# Patient Record
Sex: Male | Born: 1960 | Race: White | Hispanic: No | Marital: Married | Smoking: Former smoker
Health system: Southern US, Community
[De-identification: ages and names within clinical notes are randomized; demographics above are authoritative.]

## PROBLEM LIST (undated history)

## (undated) DIAGNOSIS — E7800 Pure hypercholesterolemia, unspecified: Secondary | ICD-10-CM

## (undated) DIAGNOSIS — Z992 Dependence on renal dialysis: Secondary | ICD-10-CM

## (undated) HISTORY — PX: CATARACT EXTRACTION W/ INTRAOCULAR LENS IMPLANT: SHX1309

## (undated) HISTORY — PX: KIDNEY TRANSPLANT: SHX239

---

## 2015-03-02 ENCOUNTER — Telehealth: Payer: Self-pay

## 2015-03-02 NOTE — Telephone Encounter (Signed)
Gastroenterology Pre-Procedure Review  Request Date: 02/25/2015 Requesting Physician: Dr. Windy Kalata ( Nephrologist)  PATIENT REVIEW QUESTIONS: The patient responded to the following health history questions as indicated:    Pt had kidney transplant in 1983/ please advise if pt needs ov first  1. Diabetes Melitis: no 2. Joint replacements in the past 12 months: no 3. Major health problems in the past 3 months: no 4. Has an artificial valve or MVP: no 5. Has a defibrillator: no 6. Has been advised in past to take antibiotics in advance of a procedure like teeth cleaning: no    MEDICATIONS & ALLERGIES:    Patient reports the following regarding taking any blood thinners:   Plavix? no Aspirin? no Coumadin? no  Patient confirms/reports the following medications:  Current Outpatient Prescriptions  Medication Sig Dispense Refill  . NON FORMULARY Taking three 15 mg tablets daily per spouse    . predniSONE (DELTASONE) 10 MG tablet Take 10 mg by mouth daily with breakfast.    . simvastatin (ZOCOR) 10 MG tablet Take 10 mg by mouth daily.     No current facility-administered medications for this visit.    Patient confirms/reports the following allergies:  No Known Allergies  No orders of the defined types were placed in this encounter.    AUTHORIZATION INFORMATION Primary Insurance:  ID #:   Group #:  Pre-Cert / Auth required:  Pre-Cert / Auth #:   Secondary Insurance:   ID #:   Group #:  Pre-Cert / Auth required:  Pre-Cert / Auth #:   SCHEDULE INFORMATION: Procedure has been scheduled as follows:  Date:                    Time:   Location:   This Gastroenterology Pre-Precedure Review Form is being routed to the following provider(s): Barney Drain, MD

## 2015-03-03 NOTE — Telephone Encounter (Signed)
SUPREP SPLIT DOSING- FULL LIQUIDS WITH BREAKFAST.   Full Liquid Diet A high-calorie, high-protein supplement should be used to meet your nutritional requirements when the full liquid diet is continued for more than 2 or 3 days. If this diet is to be used for an extended period of time (more than 7 days), a multivitamin should be considered.  Breads and Starches  Allowed: None are allowed except crackers WHOLE OR pureed (made into a thick, smooth soup) in soup.   Avoid: Any others.    Potatoes/Pasta/Rice  Allowed: ANY ITEM AS A SOUP OR SMALL PLATE OF MASHED POTATOES.       Vegetables  Allowed: Strained tomato or vegetable juice. Vegetables pureed in soup.   Avoid: Any others.    Fruit  Allowed: Any strained fruit juices and fruit drinks. Include 1 serving of citrus or vitamin C-enriched fruit juice daily.   Avoid: Any others.  Meat and Meat Substitutes  Allowed: Egg  Avoid: Any meat, fish, or fowl. All cheese.  Milk  Allowed: Milk beverages, including milk shakes and instant breakfast mixes. Smooth yogurt.   Avoid: Any others. Avoid dairy products if not tolerated.    Soups and Combination Foods  Allowed: Broth, strained cream soups. Strained, broth-based soups.   Avoid: Any others.    Desserts and Sweets  Allowed: flavored gelatin,plain ice cream, sherbet, smooth pudding, junket, fruit ices, frozen ice pops, pudding pops,, frozen fudge pops, chocolate syrup. Sugar, honey, jelly, syrup.   Avoid: Any others.  Fats and Oils  Allowed: Margarine, butter, cream, sour cream, oils.   Avoid: Any others.  Beverages  Allowed: All.   Avoid: None.  Condiments  Allowed: Iodized salt, pepper, spices, flavorings. Cocoa powder.   Avoid: Any others.    SAMPLE MEAL PLAN Breakfast   cup orange juice.   1 OR 2 EGGS   1 cup  milk.   1 cup beverage (coffee or tea).   Cream or sugar, if desired.    Midmorning Snack  2 SCRAMBLED OR HARD BOILED  EGG   Lunch  1 cup cream soup.    cup fruit juice.   1 cup milk.    cup custard.   1 cup beverage (coffee or tea).   Cream or sugar, if desired.    Midafternoon Snack  1 cup milk shake.  Dinner  1 cup cream soup.    cup fruit juice.   1 cup milk.    cup pudding.   1 cup beverage (coffee or tea).   Cream or sugar, if desired.  Evening Snack  1 cup supplement.  To increase calories, add sugar, cream, butter, or margarine if possible. Nutritional supplements will also increase the total calories.

## 2015-03-04 ENCOUNTER — Other Ambulatory Visit: Payer: Self-pay

## 2015-03-04 DIAGNOSIS — Z1211 Encounter for screening for malignant neoplasm of colon: Secondary | ICD-10-CM

## 2015-03-04 MED ORDER — NA SULFATE-K SULFATE-MG SULF 17.5-3.13-1.6 GM/177ML PO SOLN: 1.0000 | ORAL | Status: AC

## 2015-03-04 NOTE — Telephone Encounter (Signed)
Rx sent to the pharmacy and instructions mailed to pt.  

## 2015-03-16 ENCOUNTER — Telehealth: Payer: Self-pay

## 2015-03-16 NOTE — Telephone Encounter (Signed)
Ginger, please follow up on this since I will be on vacation the remainder of the week. Thanks!

## 2015-03-16 NOTE — Telephone Encounter (Signed)
I called [employer] @ [phone] and spoke to Ellie Lunch who said a PA is required for screening colonoscopy. She has started the process and the reference # is [id number].

## 2015-03-17 NOTE — Telephone Encounter (Signed)
The case was approved. I will have the paper work scaned

## 2015-03-22 ENCOUNTER — Other Ambulatory Visit: Payer: Self-pay

## 2015-03-22 ENCOUNTER — Telehealth: Payer: Self-pay | Admitting: Gastroenterology

## 2015-03-22 ENCOUNTER — Encounter (HOSPITAL_COMMUNITY)
Admission: RE | Disposition: A | Discharge status: Home / Self Care | Payer: Self-pay | Source: Ambulatory Visit | Attending: Gastroenterology

## 2015-03-22 ENCOUNTER — Ambulatory Visit (HOSPITAL_COMMUNITY)
Admission: RE | Admit: 2015-03-22 | Discharge: 2015-03-22 | Disposition: A | Discharge status: Home / Self Care | Payer: 59 | Source: Ambulatory Visit | Attending: Gastroenterology | Admitting: Gastroenterology

## 2015-03-22 ENCOUNTER — Encounter (HOSPITAL_COMMUNITY): Payer: Self-pay | Admitting: *Deleted

## 2015-03-22 DIAGNOSIS — Z1211 Encounter for screening for malignant neoplasm of colon: Secondary | ICD-10-CM | POA: Diagnosis not present

## 2015-03-22 DIAGNOSIS — Z79899 Other long term (current) drug therapy: Secondary | ICD-10-CM | POA: Diagnosis not present

## 2015-03-22 DIAGNOSIS — K5730 Diverticulosis of large intestine without perforation or abscess without bleeding: Secondary | ICD-10-CM | POA: Diagnosis not present

## 2015-03-22 DIAGNOSIS — D122 Benign neoplasm of ascending colon: Secondary | ICD-10-CM

## 2015-03-22 DIAGNOSIS — Z7952 Long term (current) use of systemic steroids: Secondary | ICD-10-CM | POA: Diagnosis not present

## 2015-03-22 DIAGNOSIS — K648 Other hemorrhoids: Secondary | ICD-10-CM | POA: Insufficient documentation

## 2015-03-22 DIAGNOSIS — M6289 Other specified disorders of muscle: Secondary | ICD-10-CM | POA: Insufficient documentation

## 2015-03-22 DIAGNOSIS — D125 Benign neoplasm of sigmoid colon: Secondary | ICD-10-CM | POA: Diagnosis not present

## 2015-03-22 DIAGNOSIS — E780 Pure hypercholesterolemia: Secondary | ICD-10-CM | POA: Diagnosis not present

## 2015-03-22 DIAGNOSIS — Z87891 Personal history of nicotine dependence: Secondary | ICD-10-CM | POA: Insufficient documentation

## 2015-03-22 DIAGNOSIS — D123 Benign neoplasm of transverse colon: Secondary | ICD-10-CM | POA: Insufficient documentation

## 2015-03-22 DIAGNOSIS — K644 Residual hemorrhoidal skin tags: Secondary | ICD-10-CM | POA: Diagnosis not present

## 2015-03-22 DIAGNOSIS — K6389 Other specified diseases of intestine: Secondary | ICD-10-CM | POA: Insufficient documentation

## 2015-03-22 DIAGNOSIS — D120 Benign neoplasm of cecum: Secondary | ICD-10-CM | POA: Insufficient documentation

## 2015-03-22 DIAGNOSIS — Z940 Kidney transplant status: Secondary | ICD-10-CM | POA: Diagnosis not present

## 2015-03-22 DIAGNOSIS — Z86010 Personal history of colonic polyps: Secondary | ICD-10-CM

## 2015-03-22 HISTORY — PX: COLONOSCOPY: SHX5424

## 2015-03-22 HISTORY — DX: Dependence on renal dialysis: Z99.2

## 2015-03-22 HISTORY — DX: Pure hypercholesterolemia, unspecified: E78.00

## 2015-03-22 SURGERY — COLONOSCOPY
Anesthesia: Moderate Sedation

## 2015-03-22 MED ORDER — STERILE WATER FOR IRRIGATION IR SOLN
Status: DC | PRN
  Administered 2015-03-22: 11:00:00

## 2015-03-22 MED ORDER — MEPERIDINE HCL 100 MG/ML IJ SOLN
INTRAMUSCULAR | Status: AC
  Filled 2015-03-22: qty 2

## 2015-03-22 MED ORDER — MIDAZOLAM HCL 5 MG/5ML IJ SOLN
INTRAMUSCULAR | Status: DC | PRN
  Administered 2015-03-22: 2 mg via INTRAVENOUS
  Administered 2015-03-22 (×2): 1 mg via INTRAVENOUS
  Administered 2015-03-22: 2 mg via INTRAVENOUS

## 2015-03-22 MED ORDER — SPOT INK MARKER SYRINGE KIT
PACK | SUBMUCOSAL | Status: DC | PRN
  Administered 2015-03-22: 3 mL via SUBMUCOSAL

## 2015-03-22 MED ORDER — SODIUM CHLORIDE 0.9 % IV SOLN
INTRAVENOUS | Status: DC
  Administered 2015-03-22: 10:00:00 via INTRAVENOUS

## 2015-03-22 MED ORDER — MEPERIDINE HCL 100 MG/ML IJ SOLN
INTRAMUSCULAR | Status: DC | PRN
  Administered 2015-03-22 (×3): 25 mg via INTRAVENOUS

## 2015-03-22 MED ORDER — MIDAZOLAM HCL 5 MG/5ML IJ SOLN
INTRAMUSCULAR | Status: AC
  Filled 2015-03-22: qty 10

## 2015-03-22 NOTE — Telephone Encounter (Signed)
Next colonoscopy in 1-2 MOS AT [place] 2 FLAT COLON POLYPS(CECUM/ASCENDING COLON). PT HAS IMAGES ON DISC. SEND IMAGES BY COURIER TO [facility] AS WELL.

## 2015-03-22 NOTE — Telephone Encounter (Signed)
Referral noted. Will contact Melanie from Endo to send images to [facility].

## 2015-03-22 NOTE — Discharge Instructions (Signed)
You had 6 polyps removed. YOU STILL HAVE 2 FLAT POLYPS IN YOUR RIGHT COLON THAT NEED TO BE REMOVED VIA ENDOSCOPIC MUCOSAL RESECTION. I TATTOOED THE BASE OF EACH ONE. You have MODERATE internal hemorrhoids and diverticulosis IN YOUR LEFT COLON.   FOLLOW A HIGH FIBER DIET. AVOID ITEMS THAT CAUSE BLOATING. SEE INFO BELOW.  YOUR BIOPSY RESULTS WILL BE AVAILABLE IN MY CHART AFTER JUN 23 AND MY OFFICE WILL CONTACT YOU IN 10-14 DAYS WITH YOUR RESULTS.   Next colonoscopy in 1-2 MOS AT [place] IN 1-3 YEARS AFTER THAT. TAKE YOUR CD WITH YOU TO YOUR VISIT.  YOUR SISTERS, BROTHERS, CHILDREN, AND PARENTS NEED TO HAVE A COLONOSCOPY STARTING AT THE AGE OF 40.    Colonoscopy Care After Read the instructions outlined below and refer to this sheet in the next week. These discharge instructions provide you with general information on caring for yourself after you leave the hospital. While your treatment has been planned according to the most current medical practices available, unavoidable complications occasionally occur. If you have any problems or questions after discharge, call DR. Takoda Janowiak, [phone].  ACTIVITY  You may resume your regular activity, but move at a slower pace for the next 24 hours.   Take frequent rest periods for the next 24 hours.   Walking will help get rid of the air and reduce the bloated feeling in your belly (abdomen).   No driving for 24 hours (because of the medicine (anesthesia) used during the test).   You may shower.   Do not sign any important legal documents or operate any machinery for 24 hours (because of the anesthesia used during the test).    NUTRITION  Drink plenty of fluids.   You may resume your normal diet as instructed by your doctor.   Begin with a light meal and progress to your normal diet. Heavy or fried foods are harder to digest and may make you feel sick to your stomach (nauseated).   Avoid alcoholic beverages for  24 hours or as instructed.    MEDICATIONS  You may resume your normal medications.   WHAT YOU CAN EXPECT TODAY  Some feelings of bloating in the abdomen.   Passage of more gas than usual.   Spotting of blood in your stool or on the toilet paper  .  IF YOU HAD POLYPS REMOVED DURING THE COLONOSCOPY:  Eat a soft diet IF YOU HAVE NAUSEA, BLOATING, ABDOMINAL PAIN, OR VOMITING.    FINDING OUT THE RESULTS OF YOUR TEST Not all test results are available during your visit. DR. Oneida Alar WILL CALL YOU WITHIN 14 DAYS OF YOUR PROCEDUE WITH YOUR RESULTS. Do not assume everything is normal if you have not heard from DR. Laconda Basich, CALL HER OFFICE AT [phone].  SEEK IMMEDIATE MEDICAL ATTENTION AND CALL THE OFFICE: [phone] IF:  You have more than a spotting of blood in your stool.   Your belly is swollen (abdominal distention).   You are nauseated or vomiting.   You have a temperature over 101F.   You have abdominal pain or discomfort that is severe or gets worse throughout the day.  Polyps, Colon  A polyp is extra tissue that grows inside your body. Colon polyps grow in the large intestine. The large intestine, also called the colon, is part of your digestive system. It is a long, hollow tube at the end of your digestive tract where your body makes and stores stool. Most polyps are not  dangerous. They are benign. This means they are not cancerous. But over time, some types of polyps can turn into cancer. Polyps that are smaller than a pea are usually not harmful. But larger polyps could someday become or may already be cancerous. To be safe, doctors remove all polyps and test them.   PREVENTION There is not one sure way to prevent polyps. You might be able to lower your risk of getting them if you:  Eat more fruits and vegetables and less fatty food.   Do not smoke.   Avoid alcohol.   Exercise every day.   Lose weight if you are overweight.   Eating more calcium and folate  can also lower your risk of getting polyps. Some foods that are rich in calcium are milk, cheese, and broccoli. Some foods that are rich in folate are chickpeas, kidney beans, and spinach.   High-Fiber Diet A high-fiber diet changes your normal diet to include more whole grains, legumes, fruits, and vegetables. Changes in the diet involve replacing refined carbohydrates with unrefined foods. The calorie level of the diet is essentially unchanged. The Dietary Reference Intake (recommended amount) for adult males is 38 grams per day. For adult females, it is 25 grams per day. Pregnant and lactating women should consume 28 grams of fiber per day. Fiber is the intact part of a plant that is not broken down during digestion. Functional fiber is fiber that has been isolated from the plant to provide a beneficial effect in the body. PURPOSE  Increase stool bulk.   Ease and regulate bowel movements.   Lower cholesterol.  INDICATIONS THAT YOU NEED MORE FIBER  Constipation and hemorrhoids.   Uncomplicated diverticulosis (intestine condition) and irritable bowel syndrome.   Weight management.   As a protective measure against hardening of the arteries (atherosclerosis), diabetes, and cancer.   GUIDELINES FOR INCREASING FIBER IN THE DIET  Start adding fiber to the diet slowly. A gradual increase of about 5 more grams (2 slices of whole-wheat bread, 2 servings of most fruits or vegetables, or 1 bowl of high-fiber cereal) per day is best. Too rapid an increase in fiber may result in constipation, flatulence, and bloating.   Drink enough water and fluids to keep your urine clear or pale yellow. Water, juice, or caffeine-free drinks are recommended. Not drinking enough fluid may cause constipation.   Eat a variety of high-fiber foods rather than one type of fiber.   Try to increase your intake of fiber through using high-fiber foods rather than fiber pills or supplements that contain small amounts of  fiber.   The goal is to change the types of food eaten. Do not supplement your present diet with high-fiber foods, but replace foods in your present diet.   INCLUDE A VARIETY OF FIBER SOURCES  Replace refined and processed grains with whole grains, canned fruits with fresh fruits, and incorporate other fiber sources. White rice, white breads, and most bakery goods contain little or no fiber.   Brown whole-grain rice, buckwheat oats, and many fruits and vegetables are all good sources of fiber. These include: broccoli, Brussels sprouts, cabbage, cauliflower, beets, sweet potatoes, white potatoes (skin on), carrots, tomatoes, eggplant, squash, berries, fresh fruits, and dried fruits.   Cereals appear to be the richest source of fiber. Cereal fiber is found in whole grains and bran. Bran is the fiber-rich outer coat of cereal grain, which is largely removed in refining. In whole-grain cereals, the bran remains. In breakfast cereals, the  largest amount of fiber is found in those with "bran" in their names. The fiber content is sometimes indicated on the label.   You may need to include additional fruits and vegetables each day.   In baking, for 1 cup white flour, you may use the following substitutions:   1 cup whole-wheat flour minus 2 tablespoons.   1/2 cup white flour plus 1/2 cup whole-wheat flour.   Diverticulosis Diverticulosis is a common condition that develops when small pouches (diverticula) form in the wall of the colon. The risk of diverticulosis increases with age. It happens more often in people who eat a low-fiber diet. Most individuals with diverticulosis have no symptoms. Those individuals with symptoms usually experience belly (abdominal) pain, constipation, or loose stools (diarrhea).  HOME CARE INSTRUCTIONS  Increase the amount of fiber in your diet as directed by your caregiver or dietician. This may reduce symptoms of diverticulosis.   Drink at least 6 to 8 glasses of  water each day to prevent constipation.   Try not to strain when you have a bowel movement.   Avoiding nuts and seeds to prevent complications is NOT NECESSARY.     FOODS HAVING HIGH FIBER CONTENT INCLUDE:  Fruits. Apple, peach, pear, tangerine, raisins, prunes.   Vegetables. Brussels sprouts, asparagus, broccoli, cabbage, carrot, cauliflower, romaine lettuce, spinach, summer squash, tomato, winter squash, zucchini.   Starchy Vegetables. Baked beans, kidney beans, lima beans, split peas, lentils, potatoes (with skin).   Grains. Whole wheat bread, brown rice, bran flake cereal, plain oatmeal, white rice, shredded wheat, bran muffins.    SEEK IMMEDIATE MEDICAL CARE IF:  You develop increasing pain or severe bloating.   You have an oral temperature above 101F.   You develop vomiting or bowel movements that are bloody or black.   Hemorrhoids Hemorrhoids are dilated (enlarged) veins around the rectum. Sometimes clots will form in the veins. This makes them swollen and painful. These are called thrombosed hemorrhoids. Causes of hemorrhoids include:  Constipation.   Straining to have a bowel movement.   HEAVY LIFTING  HOME CARE INSTRUCTIONS  Eat a well balanced diet and drink 6 to 8 glasses of water every day to avoid constipation. You may also use a bulk laxative.   Avoid straining to have bowel movements.   Keep anal area dry and clean.   Do not use a donut shaped pillow or sit on the toilet for long periods. This increases blood pooling and pain.   Move your bowels when your body has the urge; this will require less straining and will decrease pain and pressure.

## 2015-03-22 NOTE — H&P (Signed)
  Primary Care Physician:  Windy Kalata, MD Primary Gastroenterologist:  Dr. Oneida Alar  Pre-Procedure History & Physical: HPI:  Todd Bass is a 54 y.o. male here for COLON CANCER SCREENING.   Past Medical History  Diagnosis Date  . High cholesterol   . History of arteriovenostomy for renal dialysis     Past Surgical History  Procedure Laterality Date  . Kidney transplant Right   . Cataract extraction w/ intraocular lens implant Left     Prior to Admission medications   Medication Sig Start Date End Date Taking? Authorizing Provider  azaTHIOprine (IMURAN) 50 MG tablet Take 50 mg by mouth daily. Taking 3 tablets once daily.   Yes Historical Provider, MD  Na Sulfate-K Sulfate-Mg Sulf (SUPREP BOWEL PREP) SOLN Take 1 kit by mouth as directed. 03/04/15  Yes Danie Binder, MD  predniSONE (DELTASONE) 10 MG tablet Take 10 mg by mouth daily with breakfast.   Yes Historical Provider, MD  simvastatin (ZOCOR) 10 MG tablet Take 10 mg by mouth daily.   Yes Historical Provider, MD  NON FORMULARY Taking three 15 mg tablets daily per spouse    Historical Provider, MD    Allergies as of 03/04/2015  . (No Known Allergies)    Family History  Problem Relation Age of Onset  . Colon cancer Neg Hx     History   Social History  . Marital Status: Married    Spouse Name: N/A  . Number of Children: N/A  . Years of Education: N/A   Occupational History  . Not on file.   Social History Main Topics  . Smoking status: Former Smoker -- 2.00 packs/day for 20 years    Types: Cigarettes  . Smokeless tobacco: Not on file  . Alcohol Use: Not on file     Comment: 6 pack beer per month  . Drug Use: No  . Sexual Activity: Not on file   Other Topics Concern  . Not on file   Social History Narrative  . No narrative on file    Review of Systems: See HPI, otherwise negative ROS   Physical Exam: BP 117/85 mmHg  Pulse 62  Temp(Src) 97.6 F (36.4 C) (Oral)  Resp 18  Ht $R'5\' 10"'Ah$  (1.778  m)  Wt 180 lb (81.647 kg)  BMI 25.83 kg/m2  SpO2 97% General:   Alert,  pleasant and cooperative in NAD Head:  Normocephalic and atraumatic. Neck:  Supple; Lungs:  Clear throughout to auscultation.    Heart:  Regular rate and rhythm. Abdomen:  Soft, nontender and nondistended. Normal bowel sounds, without guarding, and without rebound.   Neurologic:  Alert and  oriented x4;  grossly normal neurologically.  Impression/Plan:    SCREENING  Plan:  1. TCS TODAY

## 2015-03-22 NOTE — Op Note (Signed)
[facility] [address] [place], [address]   COLONOSCOPY PROCEDURE REPORT  PATIENT: Todd Bass, Todd Bass  MR#: [id number] BIRTHDATE: [birth year] , 57  yrs. old GENDER: male ENDOSCOPIST: Danie Binder, MD REFERRED AQ:[id number] Mercy Moore, M.D. PROCEDURE DATE:  03/26/15 PROCEDURE:   Colonoscopy with cold biopsy AND with snare polypectomy, and Submucosal injection: 3 CC SPOT INDICATIONS:average risk patient for colon cancer. MEDICATIONS: Demerol 75 mg IV and Versed 6 mg IV  DESCRIPTION OF PROCEDURE:    Physical exam was performed.  Informed consent was obtained from the patient after explaining the benefits, risks, and alternatives to procedure.  The patient was connected to monitor and placed in left lateral position. Continuous oxygen was provided by nasal cannula and IV medicine administered through an indwelling cannula.  After administration of sedation and rectal exam, the patients rectum was intubated and the EC-[id number] ([id number])  colonoscope was advanced under direct visualization to the ileum.  The scope was removed slowly by carefully examining the color, texture, anatomy, and integrity mucosa on the way out.  The patient was recovered in endoscopy and discharged home in satisfactory condition. Estimated blood loss is zero unless otherwise noted in this procedure report.    COLON FINDINGS: Two flat polyps ranging from 8 to 23mm in size were found in the ascending colon and at the cecum.  A tattoo was applied TO THE BASE OF EACH(3 CC).  , Two sessile polyps ranging from 3 to 78mm in size were found at the cecum.  A polypectomy was performed with cold forceps.  , Six sessile polyps ranging from 5 to 36mm in size were found in the sigmoid colon, transverse colon, and at the hepatic flexure.  A polypectomy was performed using snare cautery.  , There was mild diverticulosis noted in the sigmoid colon with associated tortuosity and muscular hypertrophy. , Small  internal hemorrhoids were found.  , and Moderate sized external hemorrhoids were found.  PREP QUALITY: good.  CECAL W/D TIME: 26       minutes COMPLICATIONS: None  ENDOSCOPIC IMPRESSION: 1.   SIX SESSILE POLYPS REMOVED 2.   Two FLAT polyps REMIAN AND BASES ARE TATTOED 3.   Mild diverticulosis noted in the sigmoid colon 4.   Small internal hemorrhoids 5.   Moderate sized external hemorrhoids  RECOMMENDATIONS: FOLLOW A HIGH FIBER DIET. AWAIT BIOPSY RESULTS. Next colonoscopy in 1-2 MOS AT [place] IN 1-3 YEARS AFTER THAT. YOUR SISTERS, BROTHERS, CHILDREN, AND PARENTS NEED TO HAVE A COLONOSCOPY STARTING AT THE AGE OF 40.   eSigned:  Danie Binder, MD 26-Mar-2015 3:56 PM   CPT CODES: ICD CODES:  The ICD and CPT codes recommended by this software are interpretations from the data that the clinical staff has captured with the software.  The verification of the translation of this report to the ICD and CPT codes and modifiers is the sole responsibility of the health care institution and practicing physician where this report was generated.  [facility]. will not be held responsible for the validity of the ICD and CPT codes included on this report.  AMA assumes no liability for data contained or not contained herein. CPT is a Designer, television/film set of the [employer].

## 2015-03-23 ENCOUNTER — Encounter (HOSPITAL_COMMUNITY): Payer: Self-pay | Admitting: Gastroenterology

## 2015-03-23 NOTE — Telephone Encounter (Signed)
Faxed referral

## 2015-04-01 ENCOUNTER — Telehealth: Payer: Self-pay | Admitting: Gastroenterology

## 2015-04-01 NOTE — Telephone Encounter (Signed)
Please call pt. HE had simple adenomas removed.   FOLLOW A HIGH FIBER DIET. AVOID ITEMS THAT CAUSE BLOATING. Marland Kitchen  Next colonoscopy in 1-2 MOS AT [facility] IN 1-3 YEARS AFTER THAT. TAKE YOUR CD WITH YOU TO YOUR VISIT.  YOUR SISTERS, BROTHERS, CHILDREN, AND PARENTS NEED TO HAVE A COLONOSCOPY STARTING AT THE AGE OF 40.

## 2015-04-06 NOTE — Telephone Encounter (Signed)
Tried to call but the phone number is not working

## 2015-04-06 NOTE — Telephone Encounter (Signed)
Letter mailed to pt to call for results.  

## 2015-04-06 NOTE — Telephone Encounter (Signed)
Referral was made to [facility] on 03/22/2015

## 2016-10-13 ENCOUNTER — Ambulatory Visit (HOSPITAL_COMMUNITY)
Admission: RE | Admit: 2016-10-13 | Discharge: 2016-10-13 | Disposition: A | Discharge status: Home / Self Care | Payer: 59 | Source: Ambulatory Visit | Attending: Nephrology | Admitting: Nephrology

## 2016-10-13 DIAGNOSIS — Z940 Kidney transplant status: Secondary | ICD-10-CM | POA: Insufficient documentation

## 2016-10-13 DIAGNOSIS — R0602 Shortness of breath: Secondary | ICD-10-CM | POA: Diagnosis not present

## 2016-10-13 DIAGNOSIS — N183 Chronic kidney disease, stage 3 (moderate): Secondary | ICD-10-CM | POA: Insufficient documentation

## 2016-10-13 NOTE — Progress Notes (Signed)
*  PRELIMINARY RESULTS* Echocardiogram 2D Echocardiogram has been performed.  Leavy Cella 10/13/2016, 4:10 PM

## 2016-10-26 ENCOUNTER — Ambulatory Visit (HOSPITAL_COMMUNITY)
Admission: RE | Admit: 2016-10-26 | Discharge: 2016-10-26 | Disposition: A | Discharge status: Home / Self Care | Payer: 59 | Source: Ambulatory Visit | Attending: Nephrology | Admitting: Nephrology

## 2016-10-26 ENCOUNTER — Other Ambulatory Visit (HOSPITAL_COMMUNITY): Payer: Self-pay | Admitting: Nephrology

## 2016-10-26 DIAGNOSIS — Q2546 Tortuous aortic arch: Secondary | ICD-10-CM | POA: Diagnosis not present

## 2016-10-26 DIAGNOSIS — R0609 Other forms of dyspnea: Principal | ICD-10-CM

## 2016-10-30 ENCOUNTER — Other Ambulatory Visit: Payer: Self-pay | Admitting: Nephrology

## 2016-10-30 DIAGNOSIS — R0609 Other forms of dyspnea: Principal | ICD-10-CM

## 2016-10-31 ENCOUNTER — Telehealth (HOSPITAL_COMMUNITY): Payer: Self-pay

## 2016-10-31 NOTE — Telephone Encounter (Signed)
I did leave a message with pt wife at first number. She in turn gave me his cell number to reach him.  Number added to demographics. Encounter complete.

## 2016-11-02 ENCOUNTER — Ambulatory Visit (HOSPITAL_COMMUNITY)
Admission: RE | Admit: 2016-11-02 | Discharge: 2016-11-02 | Disposition: A | Discharge status: Home / Self Care | Payer: 59 | Source: Ambulatory Visit | Attending: Cardiovascular Disease | Admitting: Cardiovascular Disease

## 2016-11-02 DIAGNOSIS — R0609 Other forms of dyspnea: Secondary | ICD-10-CM | POA: Diagnosis not present

## 2016-11-02 LAB — EXERCISE TOLERANCE TEST
CHL RATE OF PERCEIVED EXERTION: 17
CSEPED: 9 min
Estimated workload: 11.2 METS
Exercise duration (sec): 47 s
MPHR: 162 {beats}/min
Peak HR: 162 {beats}/min
Percent HR: 100 %
Rest HR: 67 {beats}/min

## 2018-01-12 IMAGING — DX DG CHEST 2V
2 series · 2 of 2 positions shown · non-contrast
Comparison: None.

CLINICAL DATA: Progressive shortness of breath and cough for past
year.

EXAM:
CHEST  2 VIEW

[chest pa]
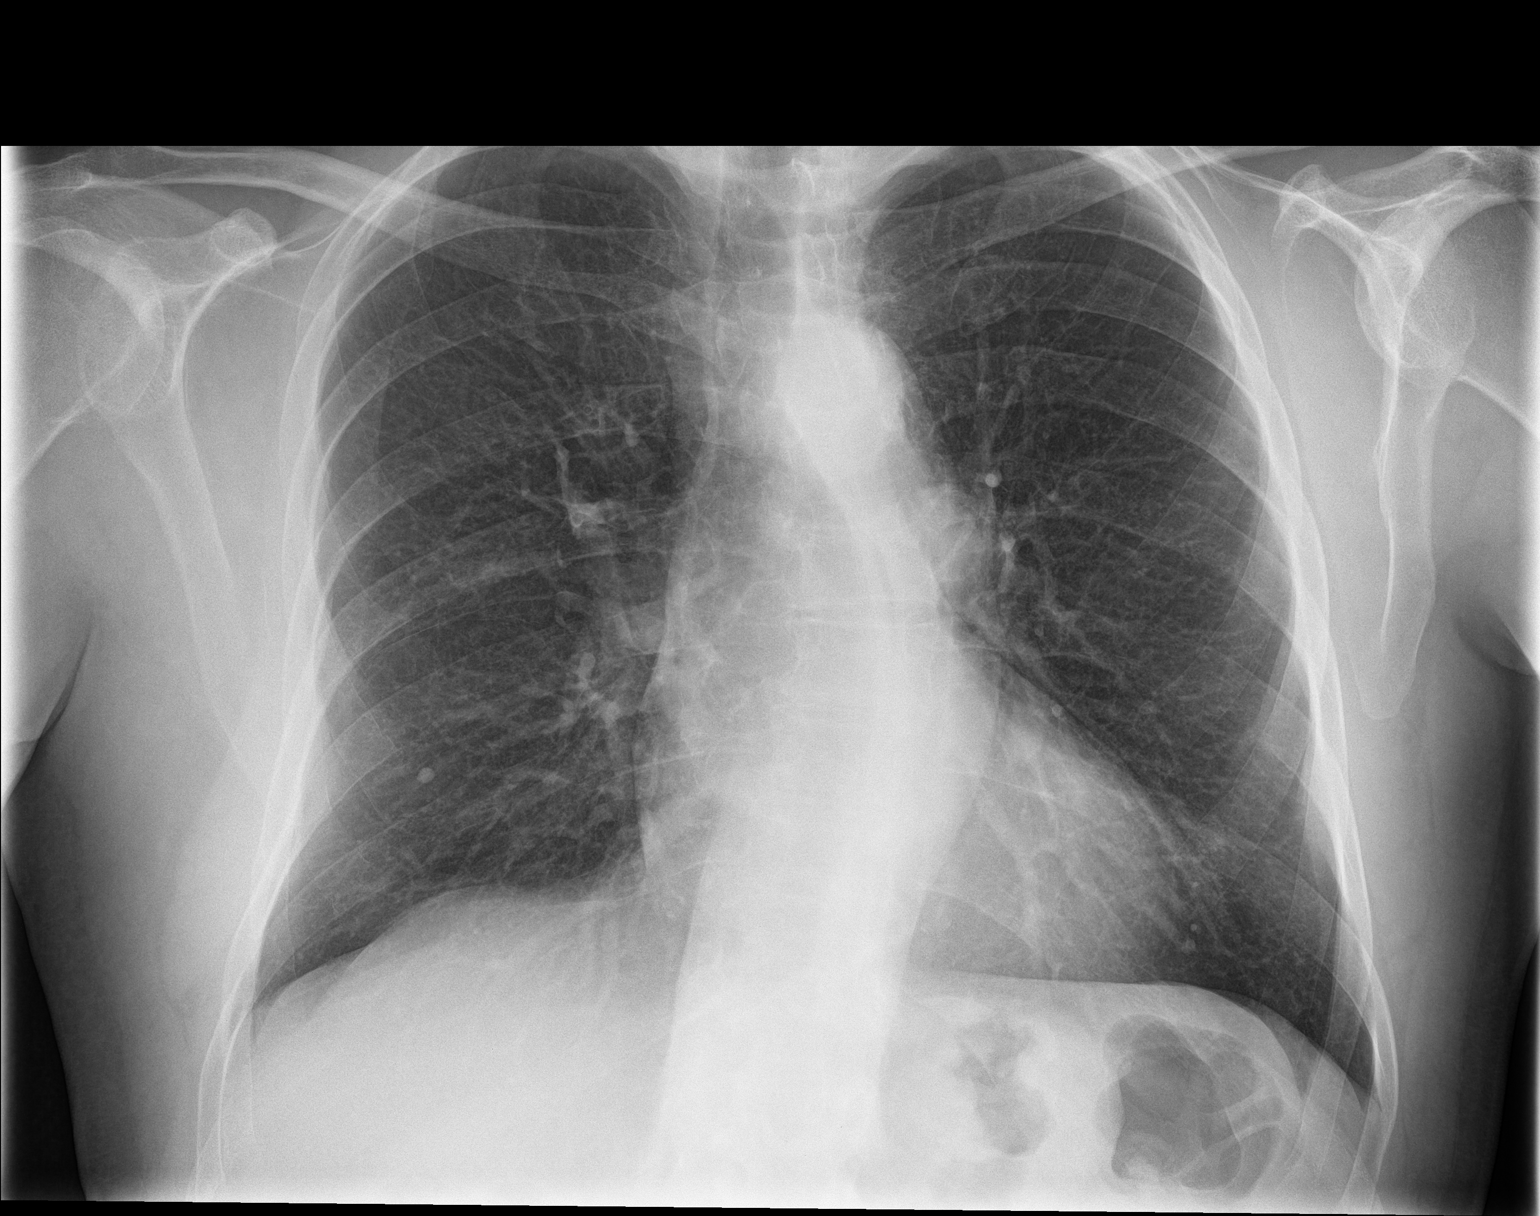

[chest lat]
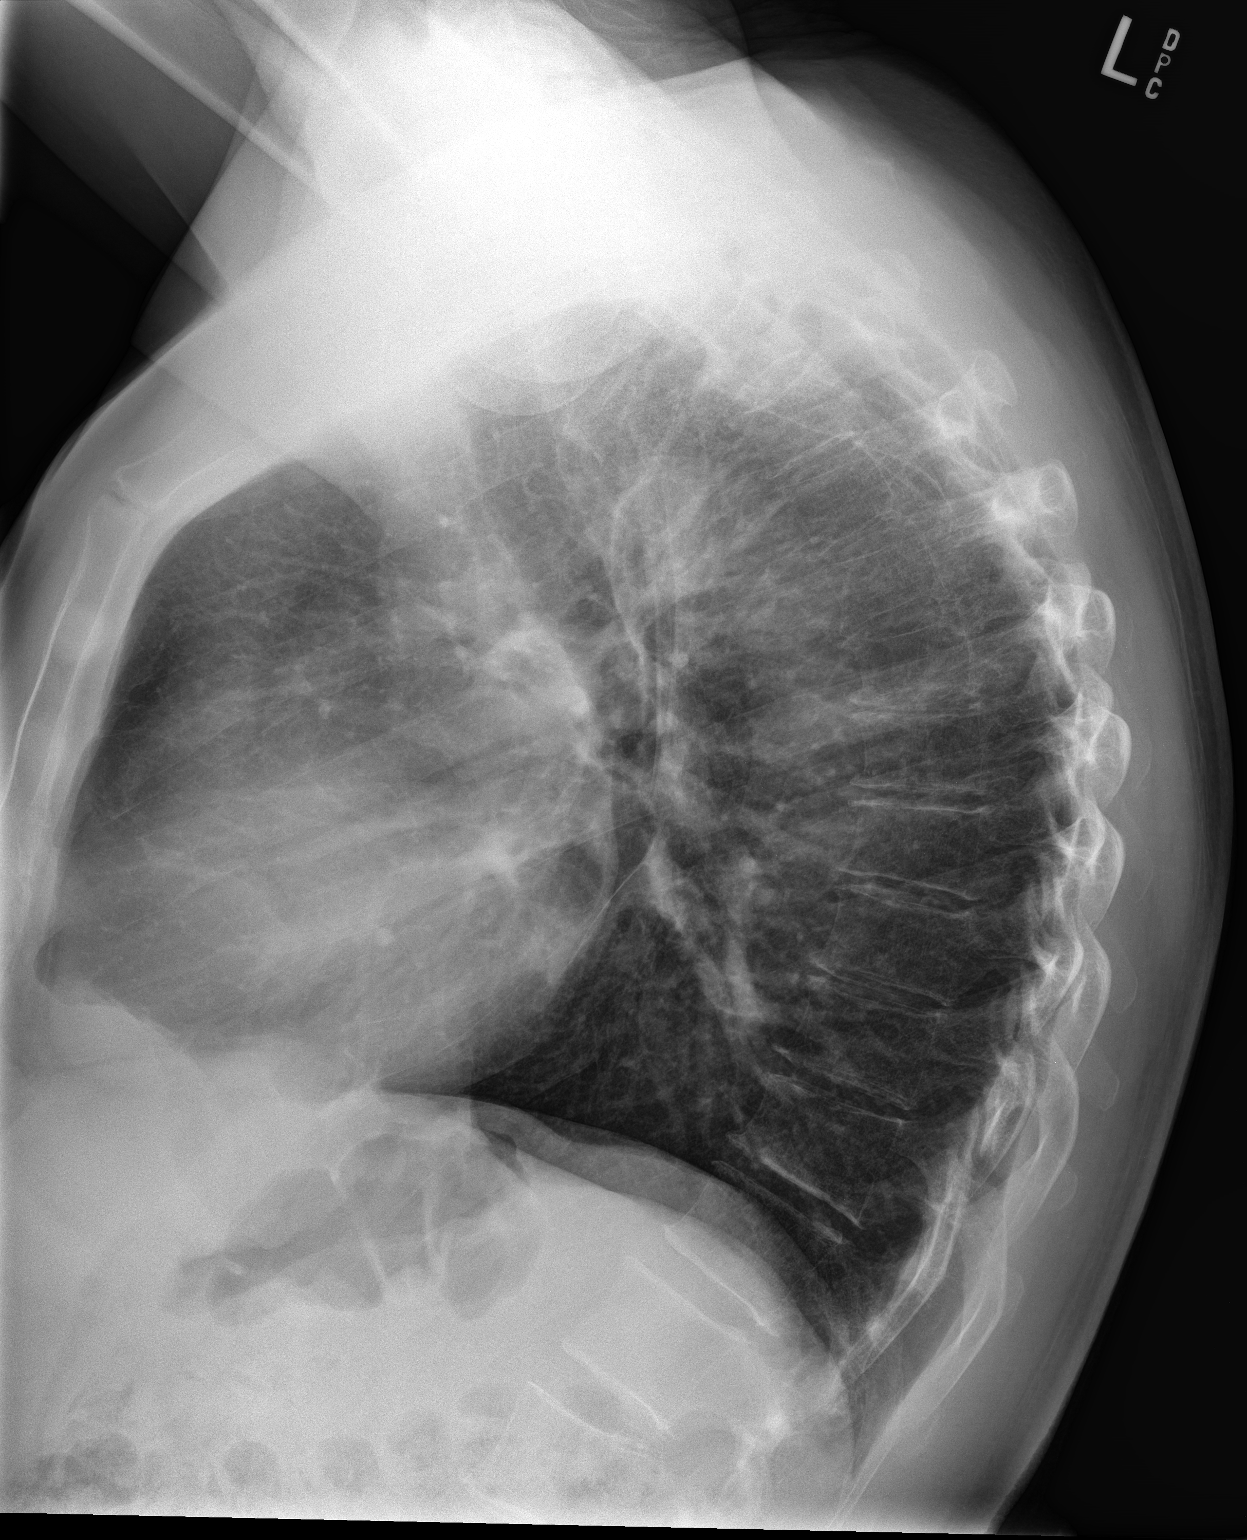

[2 of 2 positions shown; findings below may reference images not displayed]

FINDINGS: Heart size is normal. Mild tortuosity of thoracic aorta noted. Both
lungs are clear. The visualized skeletal structures are
unremarkable.
IMPRESSION: No active cardiopulmonary disease.

## 2022-08-23 ENCOUNTER — Other Ambulatory Visit: Payer: Self-pay | Admitting: *Deleted

## 2023-10-23 ENCOUNTER — Encounter (INDEPENDENT_AMBULATORY_CARE_PROVIDER_SITE_OTHER): Payer: Self-pay | Admitting: *Deleted
